# Patient Record
Sex: Male | Born: 1995 | Hispanic: No | Marital: Single | Smoking: Never smoker
Health system: Southern US, Community
[De-identification: ages and names within clinical notes are randomized; demographics above are authoritative.]

---

## 2015-05-28 ENCOUNTER — Emergency Department (HOSPITAL_COMMUNITY)
Admission: EM | Admit: 2015-05-28 | Discharge: 2015-05-28 | Disposition: A | Discharge status: Home / Self Care | Payer: Self-pay | Attending: Emergency Medicine | Admitting: Emergency Medicine

## 2015-05-28 ENCOUNTER — Encounter (HOSPITAL_COMMUNITY): Payer: Self-pay

## 2015-05-28 ENCOUNTER — Emergency Department (HOSPITAL_COMMUNITY): Payer: Self-pay

## 2015-05-28 DIAGNOSIS — Z043 Encounter for examination and observation following other accident: Secondary | ICD-10-CM | POA: Insufficient documentation

## 2015-05-28 DIAGNOSIS — F10120 Alcohol abuse with intoxication, uncomplicated: Secondary | ICD-10-CM | POA: Insufficient documentation

## 2015-05-28 DIAGNOSIS — F10920 Alcohol use, unspecified with intoxication, uncomplicated: Secondary | ICD-10-CM

## 2015-05-28 DIAGNOSIS — Y9289 Other specified places as the place of occurrence of the external cause: Secondary | ICD-10-CM | POA: Insufficient documentation

## 2015-05-28 DIAGNOSIS — Y9389 Activity, other specified: Secondary | ICD-10-CM | POA: Insufficient documentation

## 2015-05-28 DIAGNOSIS — R4182 Altered mental status, unspecified: Secondary | ICD-10-CM | POA: Insufficient documentation

## 2015-05-28 DIAGNOSIS — W1839XA Other fall on same level, initial encounter: Secondary | ICD-10-CM | POA: Insufficient documentation

## 2015-05-28 DIAGNOSIS — Y998 Other external cause status: Secondary | ICD-10-CM | POA: Insufficient documentation

## 2015-05-28 MED ORDER — ONDANSETRON 4 MG PO TBDP
4.0000 mg | ORAL_TABLET | Freq: Once | ORAL | Status: AC
  Administered 2015-05-28: 4 mg via ORAL
  Filled 2015-05-28: qty 1

## 2015-05-28 NOTE — ED Notes (Signed)
Remains on monitor VSS

## 2015-05-28 NOTE — ED Notes (Signed)
Pt sleeping.  Respirations even and non-labored.  Vitals WNL.

## 2015-05-28 NOTE — Discharge Instructions (Signed)
Alcohol Intoxication Kloss, you were seen today after drinking too much alcohol.  See a primary care physician within 3 days for close follow up. If any symptoms worsen, come back to the ED immediately.  Thank you. Alcohol intoxication occurs when you drink enough alcohol that it affects your ability to function. It can be mild or very severe. Drinking a lot of alcohol in a short time is called binge drinking. This can be very harmful. Drinking alcohol can also be more dangerous if you are taking medicines or other drugs. Some of the effects caused by alcohol may include:  Loss of coordination.  Changes in mood and behavior.  Unclear thinking.  Trouble talking (slurred speech).  Throwing up (vomiting).  Confusion.  Slowed breathing.  Twitching and shaking (seizures).  Loss of consciousness. HOME CARE  Do not drive after drinking alcohol.  Drink enough water and fluids to keep your pee (urine) clear or pale yellow. Avoid caffeine.  Only take medicine as told by your doctor. GET HELP IF:  You throw up (vomit) many times.  You do not feel better after a few days.  You frequently have alcohol intoxication. Your doctor can help decide if you should see a substance use treatment counselor. GET HELP RIGHT AWAY IF:  You become shaky when you stop drinking.  You have twitching and shaking.  You throw up blood. It may look bright red or like coffee grounds.  You notice blood in your poop (bowel movements).  You become lightheaded or pass out (faint). MAKE SURE YOU:   Understand these instructions.  Will watch your condition.  Will get help right away if you are not doing well or get worse.   This information is not intended to replace advice given to you by your health care provider. Make sure you discuss any questions you have with your health care provider.   Document Released: 01/24/2008 Document Revised: 04/09/2013 Document Reviewed: 01/10/2013 Elsevier Interactive  Patient Education [employer]

## 2015-05-28 NOTE — ED Notes (Addendum)
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  Pt admits to consuming a large amount of vodka last evening. Pt states he did not hit his head but does not recall what caused him to fall - pt in cervical collar per protocol.  No obvious injuries.  Pt awake, alert upon arrival.

## 2015-05-28 NOTE — ED Provider Notes (Signed)
CSN: [id number]     Arrival date & time 05/28/15  0026 History   By signing my name below, I, Lyndel Safe, attest that this documentation has been prepared under the direction and in the presence of Tomasita Crumble, MD. Electronically Signed: Lyndel Safe, ED Scribe. 05/28/2015. 12:40 AM.   Chief Complaint  Patient presents with  . Fall   LEVEL 5 CAVEAT: AMS SECONDARY TO ETOH CONSUMPTION   The history is provided by the patient and the EMS personnel. The history is limited by the condition of the patient. No language interpreter was used.   HPI Comments: Victor Pearson is a 19 y.o. male, with C collar placed by EMS, brought in by ambulance, who presents to the Emergency Department complaining of a fall that occurred PTA. Pt reports he consumed a significant amount of EtOH in the form of Vodka but he is unable to provide any history. He denies use of illegal drugs tonight. Per nurse, the pt was found in an elevator surrounded by emesis. Pt denies any arthralgias, neck pain, back pain or nausea.  History reviewed. No pertinent past medical history. History reviewed. No pertinent past surgical history. No family history on file. Social History  Substance Use Topics  . Smoking status: Never Smoker   . Smokeless tobacco: None  . Alcohol Use: Yes     Comment: socially    Review of Systems  Unable to perform ROS: Mental status change  Gastrointestinal: Negative for nausea.  Musculoskeletal: Negative for back pain, arthralgias and neck pain.  10 Systems reviewed and all are negative for acute change except as noted in the HPI. Allergies  Review of patient's allergies indicates no known allergies.  Home Medications   Prior to Admission medications   Not on File   BP 128/78 mmHg  Pulse 101  Temp(Src) 98.8 F (37.1 C) (Oral)  Resp 16  Ht  (2.007 m)  Wt 155 lb (70.308 kg)  BMI 17.45 kg/m2  SpO2 100% Physical Exam  Constitutional: He is oriented to person, place, and time.  Vital signs are normal. He appears well-developed and well-nourished.  Non-toxic appearance. He does not appear ill. No distress.  clinically intoxicated.   HENT:  Head: Normocephalic and atraumatic.  Nose: Nose normal.  Mouth/Throat: Oropharynx is clear and moist. No oropharyngeal exudate.  No hemotympanum bilaterally.  Eyes: Conjunctivae and EOM are normal. Pupils are equal, round, and reactive to light. No scleral icterus.  Neck: Normal range of motion. Neck supple. No tracheal deviation, no edema, no erythema and normal range of motion present. No thyroid mass and no thyromegaly present.  c-collar in place  Cardiovascular: Normal rate, regular rhythm, S1 normal, S2 normal, normal heart sounds, intact distal pulses and normal pulses.  Exam reveals no gallop and no friction rub.   No murmur heard. Pulses:      Radial pulses are 2+ on the right side, and 2+ on the left side.       Dorsalis pedis pulses are 2+ on the right side, and 2+ on the left side.  Pulmonary/Chest: Effort normal and breath sounds normal. No respiratory distress. He has no wheezes. He has no rhonchi. He has no rales.  Abdominal: Soft. Normal appearance and bowel sounds are normal. He exhibits no distension, no ascites and no mass. There is no hepatosplenomegaly. There is no tenderness. There is no rebound, no guarding and no CVA tenderness.  Musculoskeletal: Normal range of motion. He exhibits no edema or tenderness.  Lymphadenopathy:  He has no cervical adenopathy.  Neurological: He is alert and oriented to person, place, and time. He has normal strength. No cranial nerve deficit or sensory deficit.  Strength and sensation intact in all extremities.    Skin: Skin is warm, dry and intact. No petechiae and no rash noted. He is not diaphoretic. No erythema. No pallor.  Psychiatric: He has a normal mood and affect. His behavior is normal. Judgment normal.  Nursing note and vitals reviewed.   ED Course  Procedures   DIAGNOSTIC STUDIES: Oxygen Saturation is 100% on RA, normal by my interpretation.    COORDINATION OF CARE: 12:38 AM Discussed treatment plan with pt at bedside and pt agreed to plan.  CT scan of head: Impression is normal noncontrast CT head. MDM   Final diagnoses:  Alcohol intoxication, uncomplicated (HCC)     Patient presents to the emergency department for acute alcohol intoxication. He was found down on the ground in an elevator. Patient denies any pain in his head or neck to me. He admits to drinking liquor tonight. Neurological exam is completely normal. I do not believe CT imaging is warranted. Will observe in the emergency department until clinically sober or until a sober friend can take him home.  I, Brisia Schuermann, personally performed the services described in this documentation. All medical record entries made by the scribe were at my direction and in my presence.  I have reviewed the chart and discharge instructions and agree that the record reflects my personal performance and is accurate and complete. Jazelle Achey.  05/28/2015. 12:45 AM.      Tomasita Crumble, MD 05/28/15 [phone]

## 2017-03-17 IMAGING — CT CT HEAD W/O CM
2 series · 17 of 30 positions shown, 20 images · non-contrast
Comparison: None.

CLINICAL DATA: Fell in parking deck elevator. Alcohol intake. Found
down.

EXAM:
CT HEAD WITHOUT CONTRAST
TECHNIQUE: Contiguous axial images were obtained from the base of the skull
through the vertex without intravenous contrast.

[Series 2: head w/o · axial · non-contrast · 0.45mm/px · z∈[-73,+62]mm · 9 of 35 slices shown, 12 images]
[im 4/35  brain]
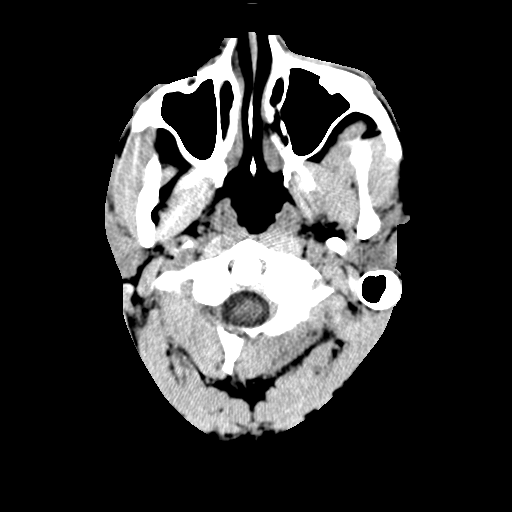
[im 4/35  bone]
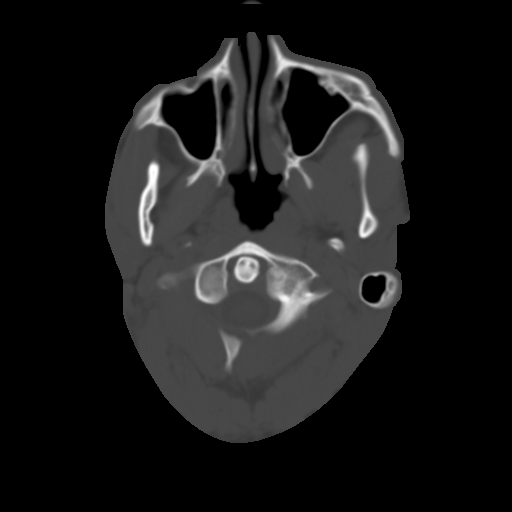
[im 7/35  brain]
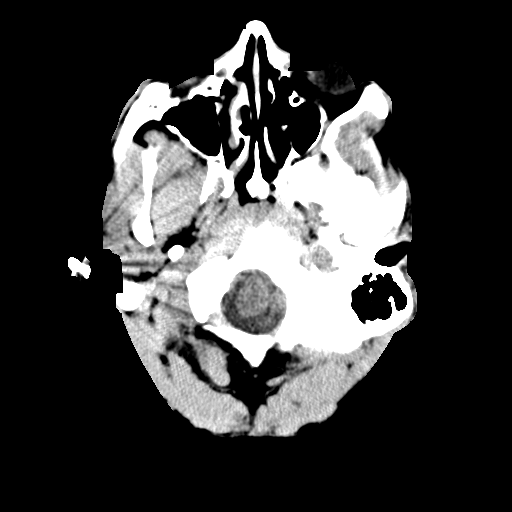
[im 11/35  brain]
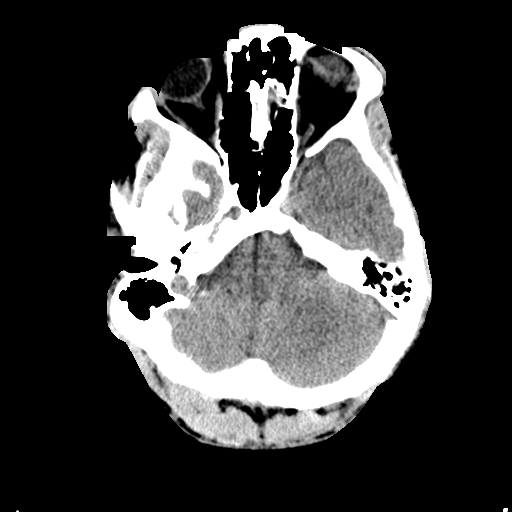
[im 14/35  brain]
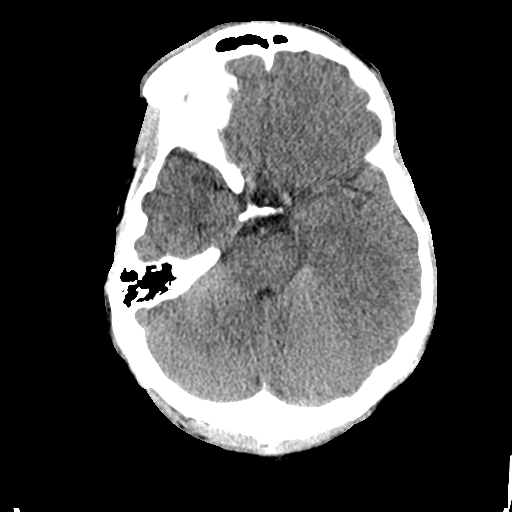
[im 18/35  brain]
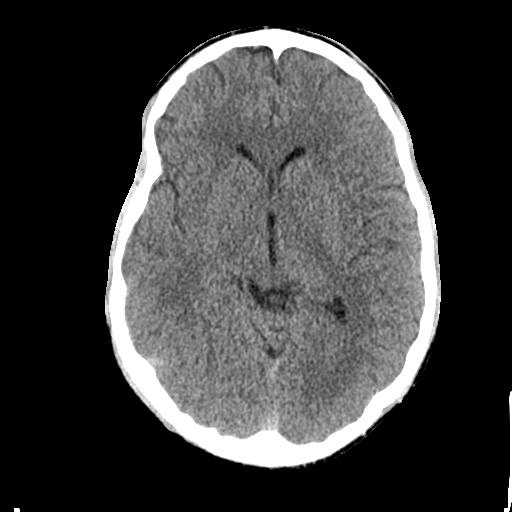
[im 18/35  bone]
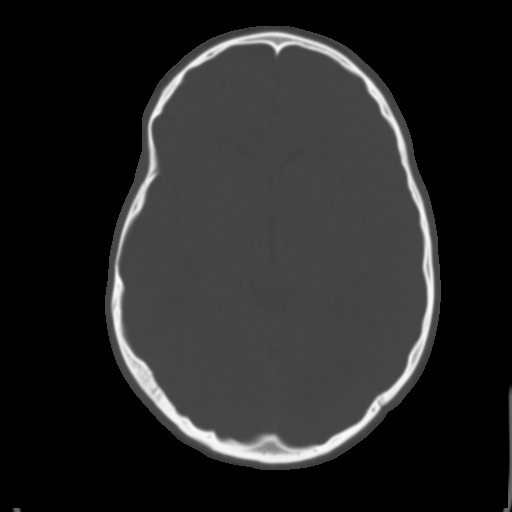
[im 21/35  brain]
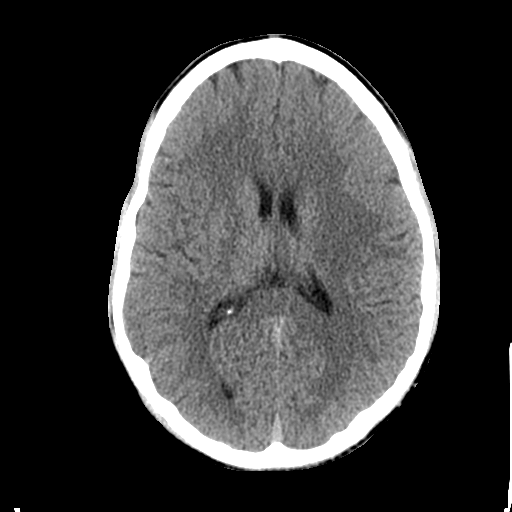
[im 24/35  brain]
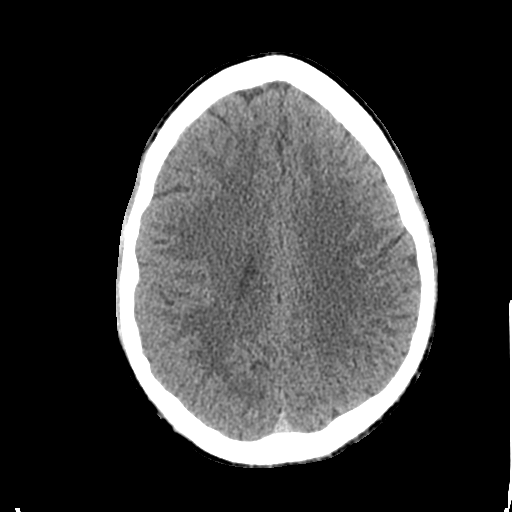
[im 28/35  brain]
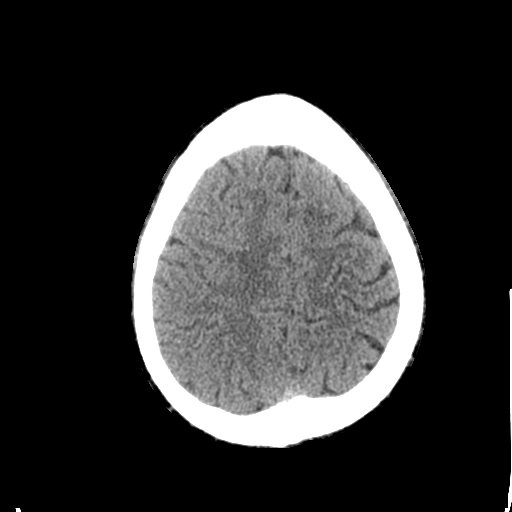
[im 31/35  brain]
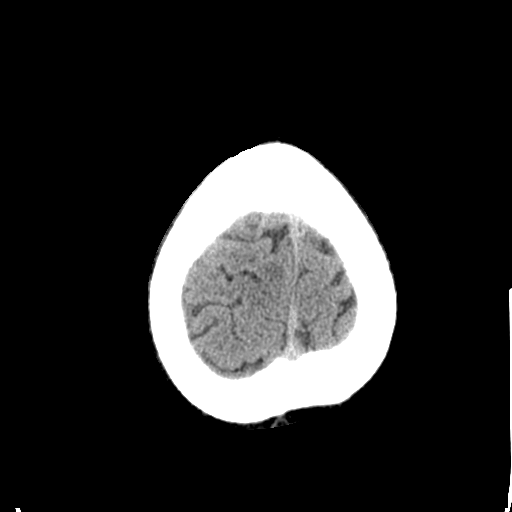
[im 31/35  bone]
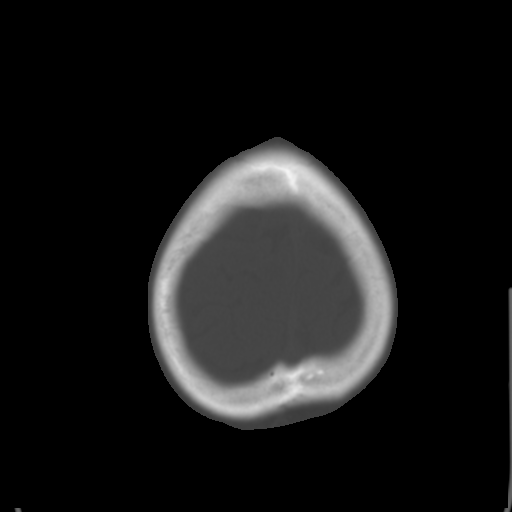

[Series 3: bone windows · axial · 0.45mm/px · z∈[-70,+62]mm · 8 of 58 slices shown]
[im 7/58  bone]
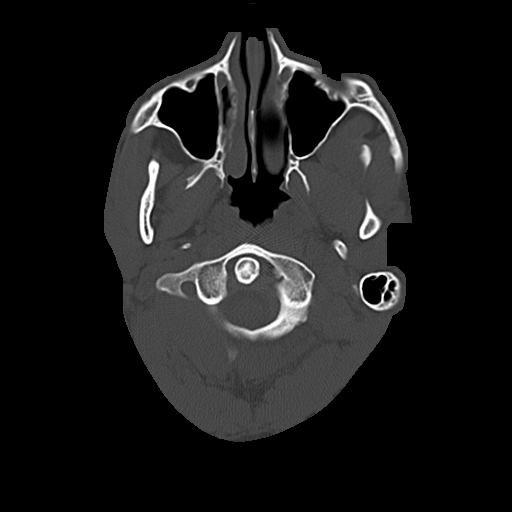
[im 13/58  bone]
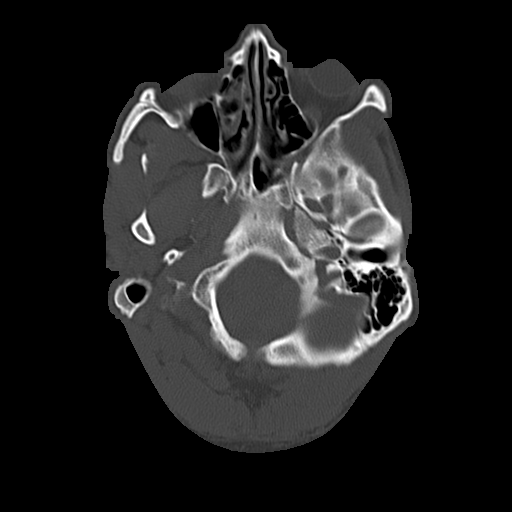
[im 20/58  bone]
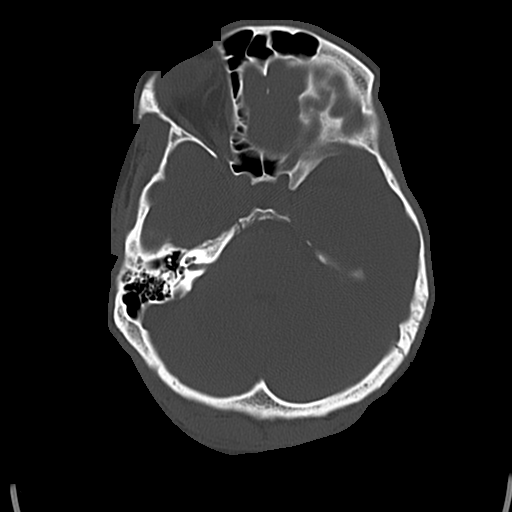
[im 26/58  bone]
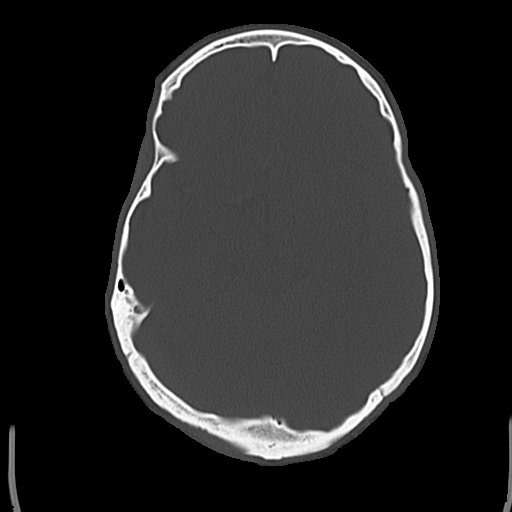
[im 32/58  bone]
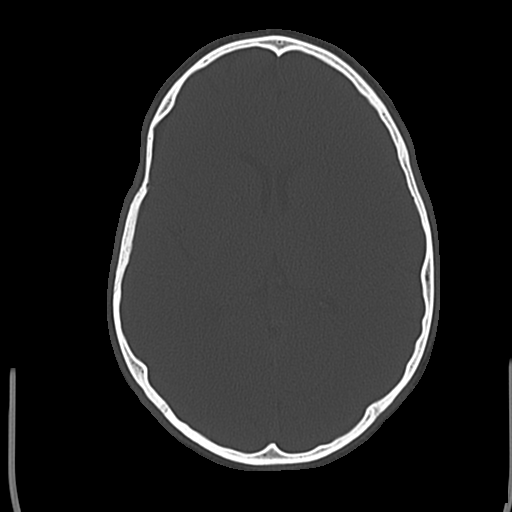
[im 39/58  bone]
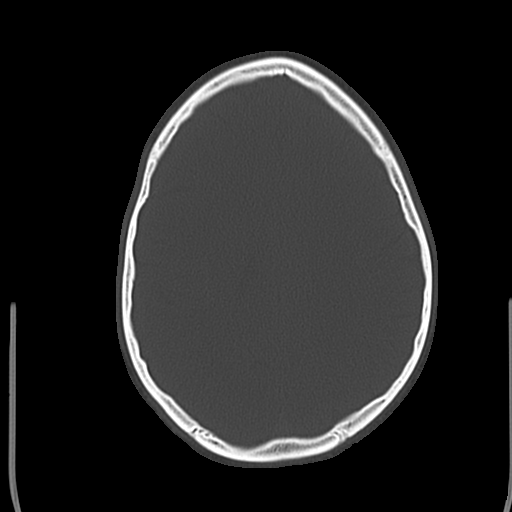
[im 45/58  bone]
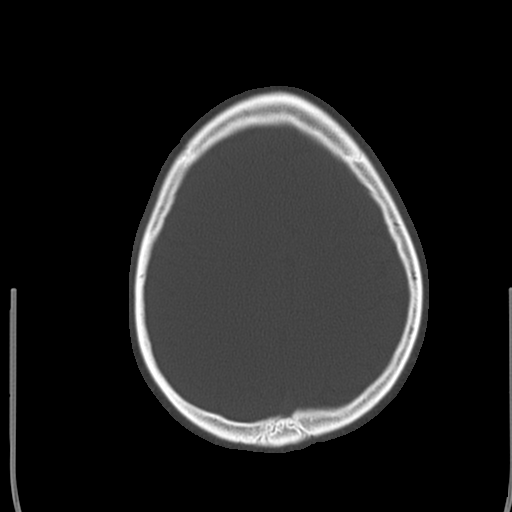
[im 51/58  bone]
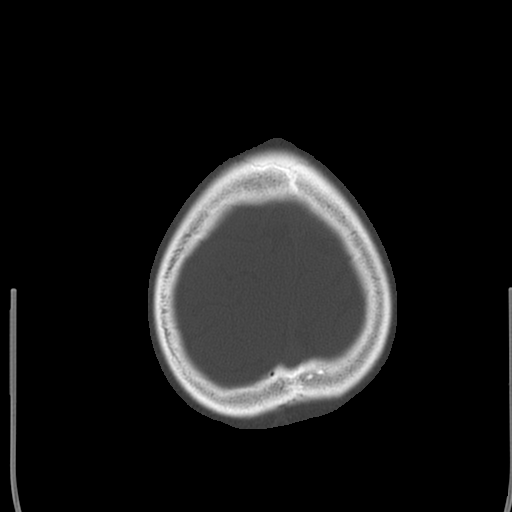

[17 of 30 positions shown; findings below may reference images not displayed]

FINDINGS: The ventricles and sulci are normal. No intraparenchymal hemorrhage,
mass effect nor midline shift. No acute large vascular territory
infarcts.

No abnormal extra-axial fluid collections. Basal cisterns are
patent.

No skull fracture. The included ocular globes and orbital contents
are non-suspicious. The mastoid aircells and included paranasal
sinuses are well-aerated.
IMPRESSION: Normal noncontrast CT head.
